# Patient Record
Sex: Male | Born: 1960 | Race: White | Hispanic: No | Marital: Single | Smoking: Never smoker
Health system: Southern US, Community
[De-identification: ages and names within clinical notes are randomized; demographics above are authoritative.]

## PROBLEM LIST (undated history)

## (undated) HISTORY — PX: CARPAL TUNNEL RELEASE: SHX101

## (undated) HISTORY — PX: BACK SURGERY: SHX140

---

## 2003-08-07 ENCOUNTER — Inpatient Hospital Stay (HOSPITAL_COMMUNITY): Admission: EM | Admit: 2003-08-07 | Discharge: 2003-08-09 | Payer: Self-pay | Admitting: Emergency Medicine

## 2003-08-07 IMAGING — CR DG CHEST 2V
2 series · 2 of 2 positions shown · non-contrast
Comparison: none

CLINICAL DATA: Pre-op HNP surgery.
 TWO VIEW CHEST   - [DATE]

[view not recorded (1 of 2)]
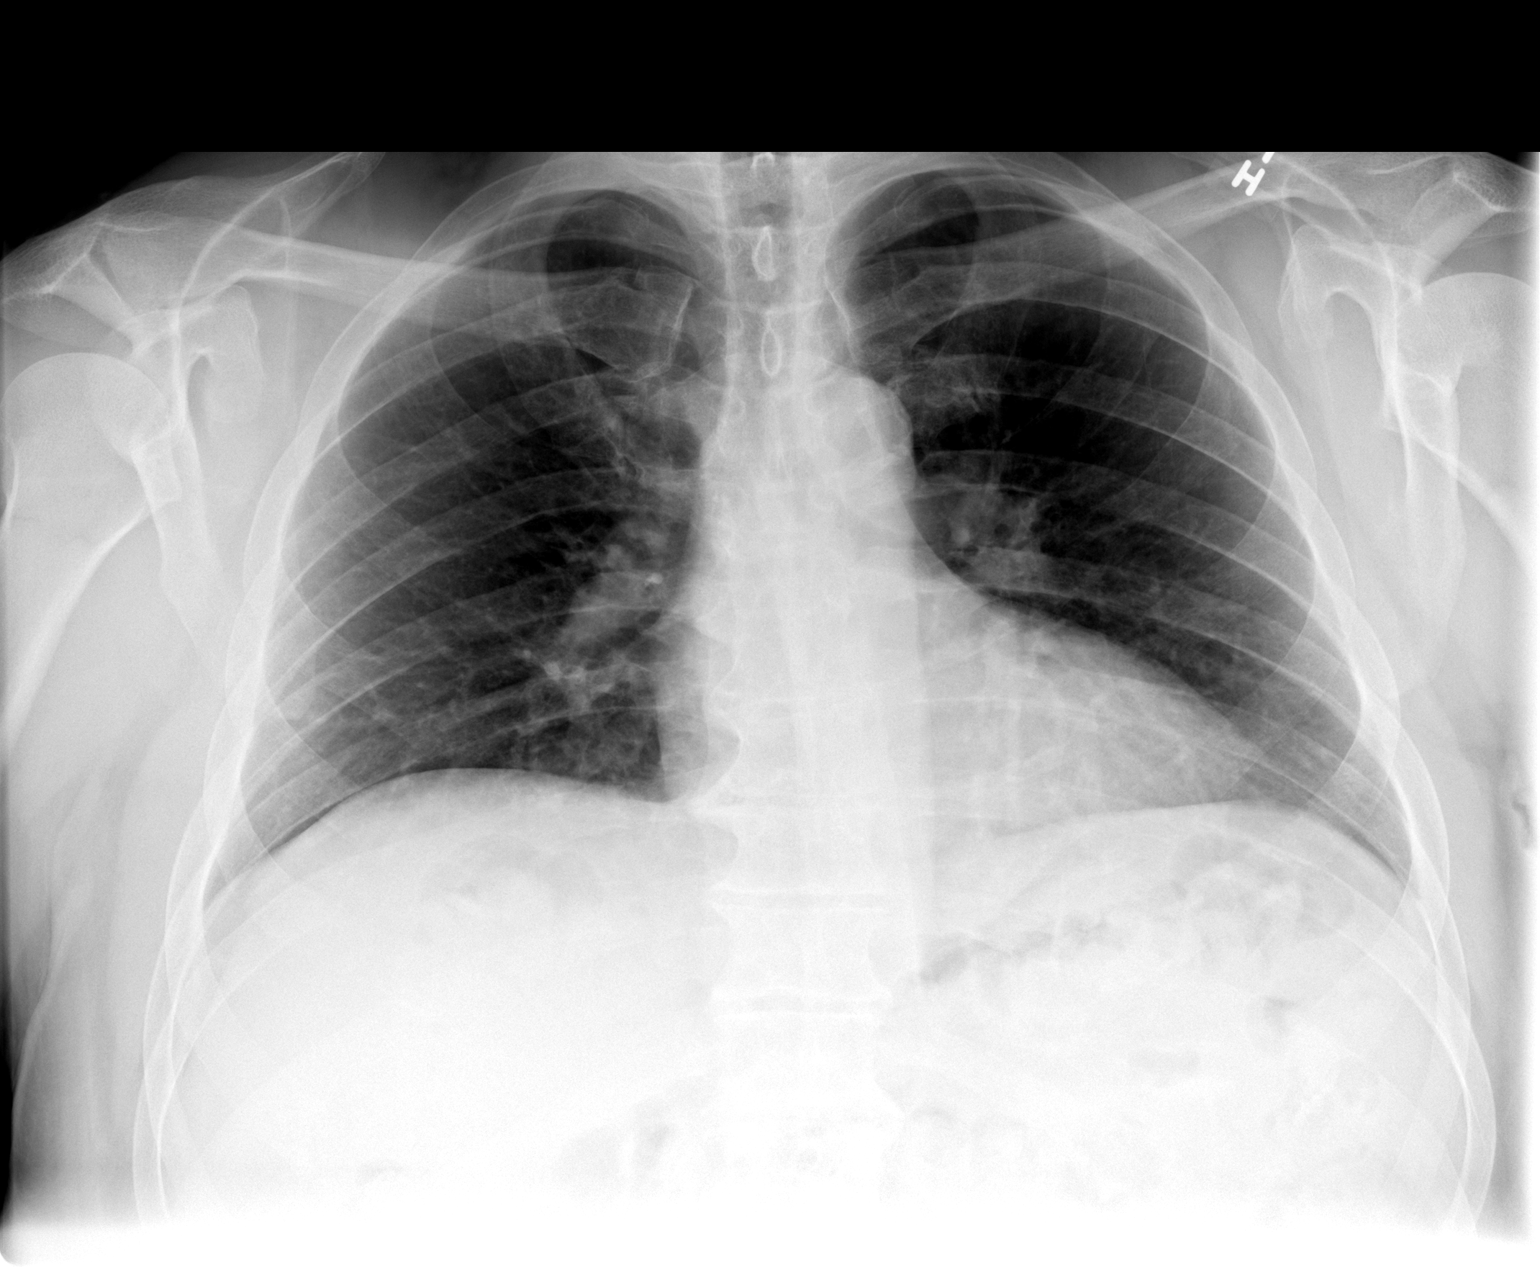

[view not recorded (2 of 2)]
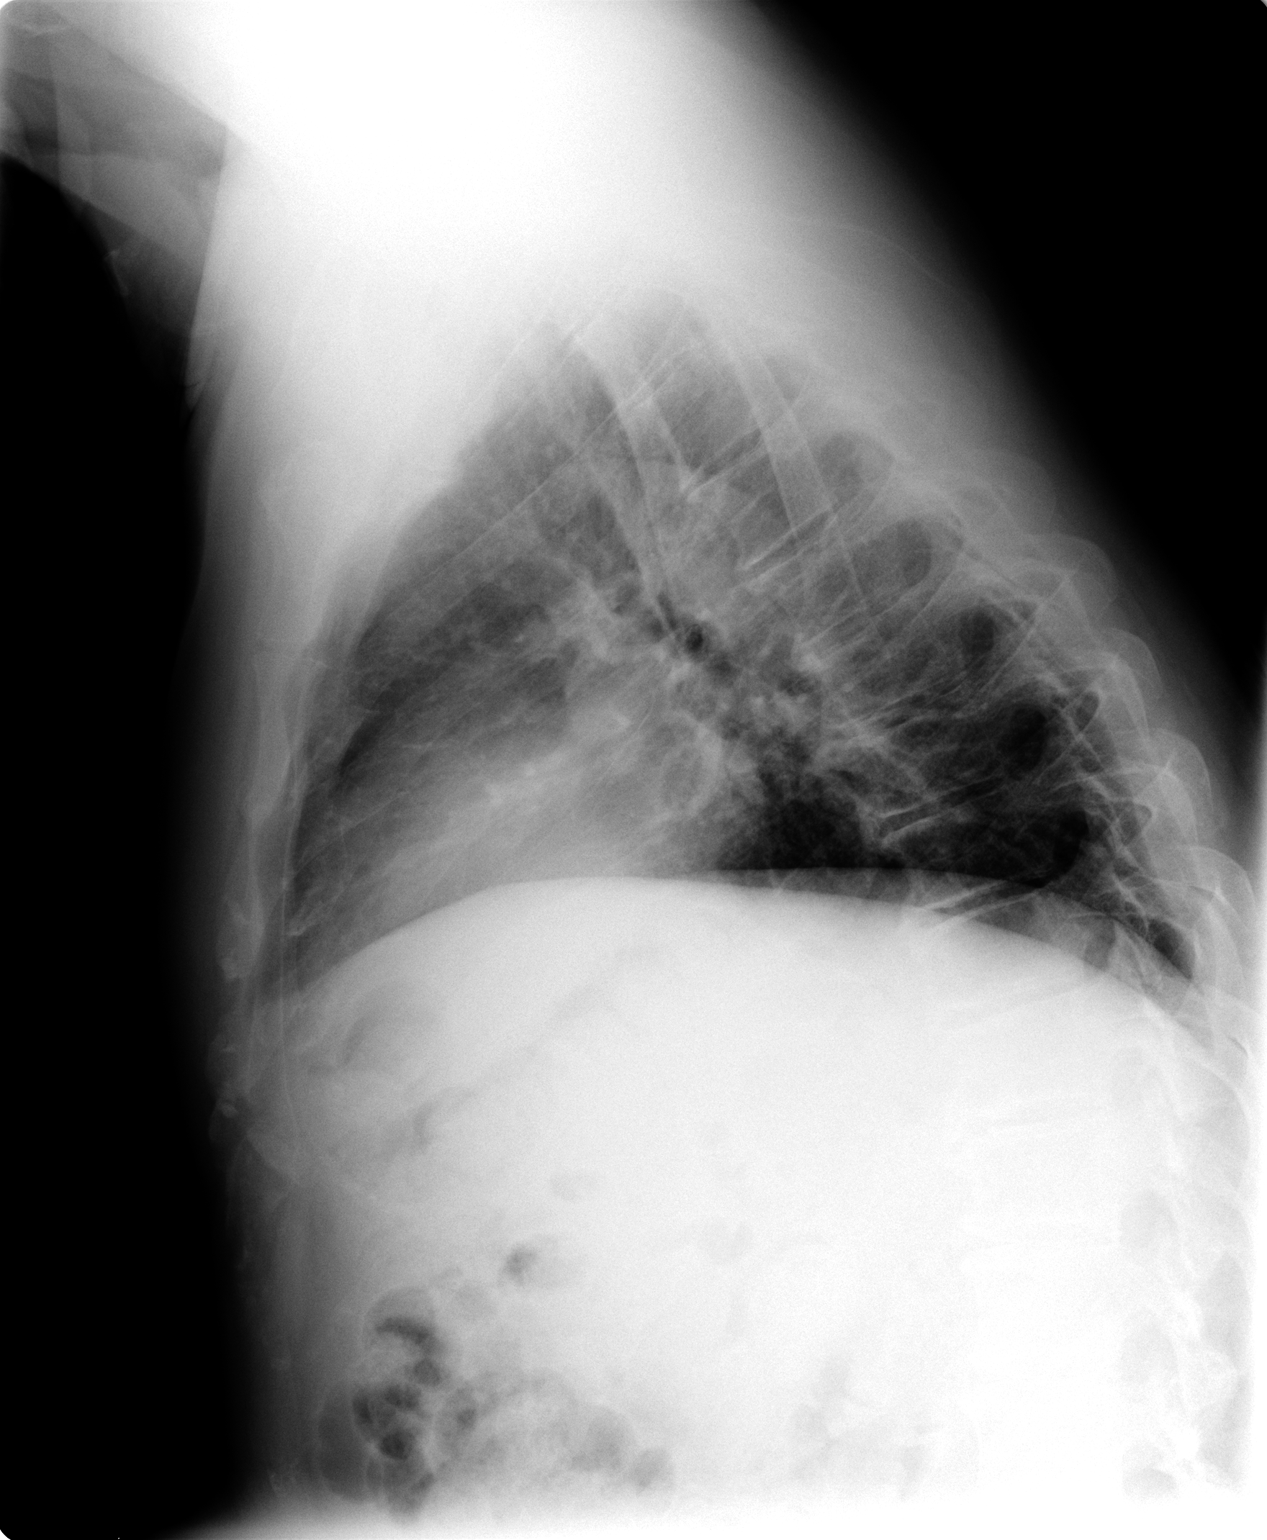

[2 of 2 positions shown; findings below may reference images not displayed]

FINDINGS: Mildly Low volume film with minimal bibasilar atelectasis.  Mild peribronchial thickening is noted.  Upper limits normal heart size.  The remainder of the lungs are clear.  No evidence of focal air space disease.
 IMPRESSION
 Mild peribronchial thickening.

## 2010-12-21 ENCOUNTER — Emergency Department (HOSPITAL_COMMUNITY)
Admission: EM | Admit: 2010-12-21 | Discharge: 2010-12-21 | Disposition: A | Discharge status: Home / Self Care | Payer: BC Managed Care – PPO | Attending: Emergency Medicine | Admitting: Emergency Medicine

## 2010-12-21 DIAGNOSIS — IMO0002 Reserved for concepts with insufficient information to code with codable children: Secondary | ICD-10-CM | POA: Insufficient documentation

## 2010-12-21 DIAGNOSIS — R51 Headache: Secondary | ICD-10-CM | POA: Insufficient documentation

## 2010-12-21 DIAGNOSIS — T169XXA Foreign body in ear, unspecified ear, initial encounter: Secondary | ICD-10-CM | POA: Insufficient documentation

## 2010-12-21 MED ORDER — HYDROCODONE-ACETAMINOPHEN 5-325 MG PO TABS: 2.0000 | ORAL_TABLET | ORAL | Status: AC | PRN

## 2010-12-21 MED ORDER — ANTIPYRINE-BENZOCAINE 5.4-1.4 % OT SOLN
3.0000 [drp] | OTIC | Status: DC | PRN
  Administered 2010-12-21: 4 [drp] via OTIC
  Filled 2010-12-21: qty 10

## 2010-12-21 MED ORDER — NEOMYCIN-POLYMYXIN-HC 3.5-10000-1 OT SOLN
3.0000 [drp] | Freq: Three times a day (TID) | OTIC | Status: DC
  Administered 2010-12-21: 3 [drp] via OTIC
  Filled 2010-12-21: qty 10

## 2010-12-21 NOTE — ED Notes (Signed)
Pt reports around 10am this am something flew into R ear.

## 2010-12-21 NOTE — ED Provider Notes (Signed)
History     CSN: [id number] Arrival date & time: 12/21/2010 12:08 PM   First MD Initiated Contact with Patient 12/21/10 1342      Chief Complaint  Patient presents with  . Otalgia    (Consider location/radiation/quality/duration/timing/severity/associated sxs/prior treatment) HPI Comments: [place] an had something to fly into the right ear. He tried irrigating by could not get the bug out.  Patient is a 50 y.o. male presenting with ear pain. The history is provided by the patient.  Otalgia This is a new problem. The current episode started 1 to 2 hours ago. There is pain in the right ear. The problem occurs constantly. The problem has been gradually worsening. There has been no fever. Associated symptoms include headaches. Pertinent negatives include no ear discharge, no abdominal pain, no neck pain and no cough. His past medical history does not include chronic ear infection, hearing loss or tympanostomy tube.    History reviewed. No pertinent past medical history.  Past Surgical History  Procedure Date  . Back surgery     No family history on file.  History  Substance Use Topics  . Smoking status: Never Smoker   . Smokeless tobacco: Not on file  . Alcohol Use: No      Review of Systems  Constitutional: Negative for activity change.       All ROS Neg except as noted in HPI  HENT: Positive for ear pain. Negative for nosebleeds, neck pain and ear discharge.   Eyes: Negative for photophobia and discharge.  Respiratory: Negative for cough, shortness of breath and wheezing.   Cardiovascular: Negative for chest pain and palpitations.  Gastrointestinal: Negative for abdominal pain and blood in stool.  Genitourinary: Negative for dysuria, frequency and hematuria.  Musculoskeletal: Negative for back pain and arthralgias.  Skin: Negative.   Neurological: Positive for headaches. Negative for dizziness, seizures and speech difficulty.    Psychiatric/Behavioral: Negative for hallucinations and confusion.    Allergies  Review of patient's allergies indicates no known allergies.  Home Medications  No current outpatient prescriptions on file.  BP 139/98  Pulse 85  Temp(Src) 98.3 F (36.8 C) (Oral)  Resp 18  Ht 5\' 10"  (1.778 m)  Wt 230 lb (104.327 kg)  BMI 33.00 kg/m2  SpO2 100%  Physical Exam  Nursing note and vitals reviewed. Constitutional: He is oriented to person, place, and time. He appears well-developed and well-nourished.  Non-toxic appearance.  HENT:  Head: Normocephalic.  Right Ear: No drainage. A foreign body is present. Tympanic membrane is injected.  Left Ear: Tympanic membrane, external ear and ear canal normal.  Eyes: EOM and lids are normal. Pupils are equal, round, and reactive to light.  Neck: Normal range of motion. Neck supple. Carotid bruit is not present.  Cardiovascular: Normal rate, regular rhythm, normal heart sounds, intact distal pulses and normal pulses.   Pulmonary/Chest: Breath sounds normal. No respiratory distress.  Abdominal: Soft. Bowel sounds are normal. There is no tenderness. There is no guarding.  Musculoskeletal: Normal range of motion.  Lymphadenopathy:       Head (right side): No submandibular adenopathy present.       Head (left side): No submandibular adenopathy present.    He has no cervical adenopathy.  Neurological: He is alert and oriented to person, place, and time. He has normal strength. No cranial nerve deficit or sensory deficit.  Skin: Skin is warm and dry.  Psychiatric: He has a normal  mood and affect. His speech is normal.    ED Course  Procedures (including critical care time) The right EAC was irrigated with warm water until FB removed. Shallow laceration of the EAC and increase redness of the TM on the right. Labs Reviewed - No data to display No results found.      MDM  FB noted in the right EAC with mild trauma. Cortasporin and oral  antibiotics ordered.        Kathie Dike, [place] 01/03/11 [phone]

## 2010-12-27 NOTE — ED Provider Notes (Signed)
Medical screening examination/treatment/procedure(s) were performed by non-physician practitioner and as supervising physician I was immediately available for consultation/collaboration.  Karolyne Timmons P Majid Mccravy, MD 12/27/10 0654 

## 2011-01-06 NOTE — ED Provider Notes (Signed)
Medical screening examination/treatment/procedure(s) were performed by non-physician practitioner and as supervising physician I was immediately available for consultation/collaboration.  Nicholes Stairs, MD 01/06/11 [phone]

## 2013-09-29 ENCOUNTER — Telehealth: Payer: Self-pay

## 2013-09-29 NOTE — Telephone Encounter (Signed)
Patient received letter from triage nurse. Triage nurse not available to take call. Patient works 3rd shift and was getting ready to go to bed and wanted to know when would be a good time to call in the mornings. I told him around 11am or I could let DS know he works 3 rd shift and see if she could call first thing in the morning around 8am. Patient said that he would just call back later. [phone]

## 2013-10-08 ENCOUNTER — Other Ambulatory Visit: Payer: Self-pay

## 2013-10-08 DIAGNOSIS — Z1211 Encounter for screening for malignant neoplasm of colon: Secondary | ICD-10-CM

## 2013-10-09 NOTE — Telephone Encounter (Signed)
Appropriate.

## 2013-10-09 NOTE — Telephone Encounter (Signed)
Gastroenterology Pre-Procedure Review  Request Date: 09/29/2013 Requesting Physician: Dr. Phillips OdorGolding  PATIENT REVIEW QUESTIONS: The patient responded to the following health history questions as indicated:    1. Diabetes Melitis: no 2. Joint replacements in the past 12 months: no 3. Major health problems in the past 3 months: no 4. Has an artificial valve or MVP: no 5. Has a defibrillator: no 6. Has been advised in past to take antibiotics in advance of a procedure like teeth cleaning: no    MEDICATIONS & ALLERGIES:    Patient reports the following regarding taking any blood thinners:   Plavix? no Aspirin? YES Coumadin? no  Patient confirms/reports the following medications:  Current Outpatient Prescriptions  Medication Sig Dispense Refill  . aspirin 81 MG tablet Take 81 mg by mouth daily.      . Multiple Vitamin (MULTIVITAMIN) tablet Take 1 tablet by mouth daily. 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    Patient confirms/reports the following allergies:  Allergies  Allergen Reactions  . Bee Venom Other (See Comments)    Yellow Jackets :Blindness, pain    No orders of the defined types were placed in this encounter.    AUTHORIZATION INFORMATION Primary Insurance:   ID #:   Group #:  Pre-Cert / Auth required:  Pre-Cert / Auth #:   Secondary Insurance:  ID #:   Group #:  Pre-Cert / Auth required: Pre-Cert / Auth #:   SCHEDULE INFORMATION: Procedure has been scheduled as follows:  Date: 11/03/2013                  Time:  7:30 AM Location: [facility] Short Stay  This Gastroenterology Pre-Precedure Review Form is being routed to the following provider(s): R. Roetta SessionsMichael Rourk, MD

## 2013-10-14 ENCOUNTER — Telehealth: Payer: Self-pay

## 2013-10-14 MED ORDER — PEG-KCL-NACL-NASULF-NA ASC-C 100 G PO SOLR: 1.0000 | ORAL | Status: DC

## 2013-10-14 NOTE — Telephone Encounter (Signed)
Rx sent to the pharmacy and instructions mailed to pt.  

## 2013-10-14 NOTE — Addendum Note (Signed)
Addended by: Lavena BullionSTEWART, [place] Mincey H on: 10/14/2013 10:22 AM   Modules accepted: Orders

## 2013-10-14 NOTE — Telephone Encounter (Signed)
I called and left Vm for a return call to get phone number on the back of his insurance card for providers, to see if a PA is required for a screening colonoscopy.

## 2013-10-22 ENCOUNTER — Encounter (HOSPITAL_COMMUNITY): Payer: Self-pay | Admitting: Pharmacy Technician

## 2013-10-30 ENCOUNTER — Telehealth: Payer: Self-pay

## 2013-10-30 NOTE — Telephone Encounter (Signed)
I called and spoke to [place] at [place]  [phone] ) and PA is not required for screening colonoscopy.

## 2013-11-03 ENCOUNTER — Encounter (HOSPITAL_COMMUNITY)
Admission: RE | Disposition: A | Discharge status: Home / Self Care | Payer: Self-pay | Source: Ambulatory Visit | Attending: Internal Medicine

## 2013-11-03 ENCOUNTER — Ambulatory Visit (HOSPITAL_COMMUNITY)
Admission: RE | Admit: 2013-11-03 | Discharge: 2013-11-03 | Disposition: A | Discharge status: Home / Self Care | Payer: BC Managed Care – PPO | Source: Ambulatory Visit | Attending: Internal Medicine | Admitting: Internal Medicine

## 2013-11-03 ENCOUNTER — Encounter (HOSPITAL_COMMUNITY): Payer: Self-pay

## 2013-11-03 DIAGNOSIS — Z7982 Long term (current) use of aspirin: Secondary | ICD-10-CM | POA: Insufficient documentation

## 2013-11-03 DIAGNOSIS — K5731 Diverticulosis of large intestine without perforation or abscess with bleeding: Secondary | ICD-10-CM

## 2013-11-03 DIAGNOSIS — K5730 Diverticulosis of large intestine without perforation or abscess without bleeding: Secondary | ICD-10-CM | POA: Diagnosis not present

## 2013-11-03 DIAGNOSIS — Z1211 Encounter for screening for malignant neoplasm of colon: Secondary | ICD-10-CM | POA: Insufficient documentation

## 2013-11-03 HISTORY — PX: COLONOSCOPY: SHX5424

## 2013-11-03 SURGERY — COLONOSCOPY
Anesthesia: Moderate Sedation

## 2013-11-03 MED ORDER — MEPERIDINE HCL 100 MG/ML IJ SOLN
INTRAMUSCULAR | Status: AC
  Filled 2013-11-03: qty 2

## 2013-11-03 MED ORDER — MEPERIDINE HCL 100 MG/ML IJ SOLN
INTRAMUSCULAR | Status: DC | PRN
  Administered 2013-11-03: 25 mg via INTRAVENOUS
  Administered 2013-11-03: 50 mg via INTRAVENOUS

## 2013-11-03 MED ORDER — STERILE WATER FOR IRRIGATION IR SOLN
Status: DC | PRN
Start: 2013-11-03 — End: 2013-11-03
  Administered 2013-11-03: 08:00:00

## 2013-11-03 MED ORDER — SODIUM CHLORIDE 0.9 % IV SOLN
INTRAVENOUS | Status: DC
  Administered 2013-11-03: 07:00:00 via INTRAVENOUS

## 2013-11-03 MED ORDER — ONDANSETRON HCL 4 MG/2ML IJ SOLN
INTRAMUSCULAR | Status: AC
  Filled 2013-11-03: qty 2

## 2013-11-03 MED ORDER — MIDAZOLAM HCL 5 MG/5ML IJ SOLN
INTRAMUSCULAR | Status: DC | PRN
  Administered 2013-11-03: 1 mg via INTRAVENOUS
  Administered 2013-11-03 (×2): 2 mg via INTRAVENOUS

## 2013-11-03 MED ORDER — ONDANSETRON HCL 4 MG/2ML IJ SOLN
INTRAMUSCULAR | Status: DC | PRN
  Administered 2013-11-03: 4 mg via INTRAVENOUS

## 2013-11-03 MED ORDER — MIDAZOLAM HCL 5 MG/5ML IJ SOLN
INTRAMUSCULAR | Status: AC
  Filled 2013-11-03: qty 10

## 2013-11-03 NOTE — Op Note (Signed)
[facility] [address] [place], [address]   COLONOSCOPY PROCEDURE REPORT  PATIENT: Craig Ramirez, Craig Ramirez  MR#:         [id number] BIRTHDATE: [birth year] , 53  yrs. old GENDER: Male ENDOSCOPIST: R.  Roetta Sessions, MD FACP FACG REFERRED BY:  Assunta Found, M.D. PROCEDURE DATE:  11/03/2013 PROCEDURE:     Screening colonoscopy  INDICATIONS: First ever average risk colon cancer screening examination  INFORMED CONSENT:  The risks, benefits, alternatives and imponderables including but not limited to bleeding, perforation as well as the possibility of a missed lesion have been reviewed.  The potential for biopsy, lesion removal, etc. have also been discussed.  Questions have been answered.  All parties agreeable. Please see the history and physical in the medical record for more information.  MEDICATIONS: Versed 5 mg IV and Demerol 75 mg IV in divided doses. Zofran 4 mg IV  DESCRIPTION OF PROCEDURE:  After a digital rectal exam was performed, the EC-[id number] ([id number])  colonoscope was advanced from the anus through the rectum and colon to the area of the cecum, ileocecal valve and appendiceal orifice.  The cecum was deeply intubated.  These structures were well-seen and photographed for the record.  From the level of the cecum and ileocecal valve, the scope was slowly and cautiously withdrawn.  The mucosal surfaces were carefully surveyed utilizing scope tip deflection to facilitate fold flattening as needed.  The scope was pulled down into the rectum where a thorough examination including retroflexion was performed.    FINDINGS:  Adequate preparation.  Normal rectum. Scattered pancolonic diverticula; remainder of the colonic mucosa appeared normal.  THERAPEUTIC / DIAGNOSTIC MANEUVERS PERFORMED:  none  COMPLICATIONS: none  CECAL WITHDRAWAL TIME:  10 minutes  IMPRESSION:  Colonic diverticulosis  RECOMMENDATIONS: Repeat screening colonoscopy in 10  years   _______________________________ eSigned:  R. Roetta Sessions, MD FACP [facility] 11/03/2013 8:04 AM   CC:

## 2013-11-03 NOTE — H&P (Signed)
@  ZOXW@   Primary Care Physician:  Colette Ribas, MD Primary Gastroenterologist:  Dr. Jena Gauss  Pre-Procedure History & Physical: HPI:  Craig Ramirez is a 53 y.o. male is here for a screening colonoscopy. No bowel symptoms. No family history of colon cancer.   No prior colonoscopy.  History reviewed. No pertinent past medical history.  Past Surgical History  Procedure Laterality Date  . Back surgery    . Carpal tunnel release Bilateral     Prior to Admission medications   Medication Sig Start Date End Date Taking? Authorizing Provider  aspirin 81 MG tablet Take 81 mg by mouth daily.   Yes Historical Provider, MD    Allergies as of 10/08/2013 - Review Complete 12/21/2010  Allergen Reaction Noted  . Bee venom Other (See Comments) 12/21/2010    History reviewed. No pertinent family history.  History   Social History  . Marital Status: Single    Spouse Name: N/A    Number of Children: N/A  . Years of Education: N/A   Occupational History  . Not on file.   Social History Main Topics  . Smoking status: Never Smoker   . Smokeless tobacco: Not on file  . Alcohol Use: No  . Drug Use: No  . Sexual Activity: Yes    Birth Control/ Protection: None   Other Topics Concern  . Not on file   Social History Narrative  . No narrative on file    Review of Systems: See HPI, otherwise negative ROS  Physical Exam: BP 134/80  Pulse 58  Temp(Src) 98.3 F (36.8 C) (Oral)  Resp 12  Ht  (1.778 m)  Wt 230 lb (104.327 kg)  BMI 33.00 kg/m2  SpO2 96% General:   Alert,  Well-developed, well-nourished, pleasant and cooperative in NAD Head:  Normocephalic and atraumatic. Eyes:  Sclera clear, no icterus.   Conjunctiva pink. Ears:  Normal auditory acuity. Nose:  No deformity, discharge,  or lesions. Mouth:  No deformity or lesions, dentition normal. Neck:  Supple; no masses or thyromegaly. Lungs:  Clear throughout to auscultation.   No wheezes, crackles, or rhonchi.  No acute distress. Heart:  Regular rate and rhythm; no murmurs, clicks, rubs,  or gallops. Abdomen:  Soft, nontender and nondistended. No masses, hepatosplenomegaly or hernias noted. Normal bowel sounds, without guarding, and without rebound.   Msk:  Symmetrical without gross deformities. Normal posture. Pulses:  Normal pulses noted. Extremities:  Without clubbing or edema. Neurologic:  Alert and  oriented x4;  grossly normal neurologically. Skin:  Intact without significant lesions or rashes. Cervical Nodes:  No significant cervical adenopathy. Psych:  Alert and cooperative. Normal mood and affect.  Impression/Plan: Craig Ramirez is now here to undergo a screening colonoscopy.  First-ever average risk screening examination  Risks, benefits, limitations, imponderables and alternatives regarding colonoscopy have been reviewed with the patient. Questions have been answered. All parties agreeable.     Notice:  This dictation was prepared with Dragon dictation along with smaller phrase technology. Any transcriptional errors that result from this process are unintentional and may not be corrected upon review.

## 2013-11-03 NOTE — Discharge Instructions (Addendum)
Colonoscopy Discharge Instructions  Read the instructions outlined below and refer to this sheet in the next few weeks. These discharge instructions provide you with general information on caring for yourself after you leave the hospital. Your doctor may also give you specific instructions. While your treatment has been planned according to the most current medical practices available, unavoidable complications occasionally occur. If you have any problems or questions after discharge, call Dr. Gala Romney at [phone]. ACTIVITY  You may resume your regular activity, but move at a slower pace for the next 24 hours.   Take frequent rest periods for the next 24 hours.   Walking will help get rid of the air and reduce the bloated feeling in your belly (abdomen).   No driving for 24 hours (because of the medicine (anesthesia) used during the test).    Do not sign any important legal documents or operate any machinery for 24 hours (because of the anesthesia used during the test).  NUTRITION  Drink plenty of fluids.   You may resume your normal diet as instructed by your doctor.   Begin with a light meal and progress to your normal diet. Heavy or fried foods are harder to digest and may make you feel sick to your stomach (nauseated).   Avoid alcoholic beverages for 24 hours or as instructed.  MEDICATIONS  You may resume your normal medications unless your doctor tells you otherwise.  WHAT YOU CAN EXPECT TODAY  Some feelings of bloating in the abdomen.   Passage of more gas than usual.   Spotting of blood in your stool or on the toilet paper.  IF YOU HAD POLYPS REMOVED DURING THE COLONOSCOPY:  No aspirin products for 7 days or as instructed.   No alcohol for 7 days or as instructed.   Eat a soft diet for the next 24 hours.  FINDING OUT THE RESULTS OF YOUR TEST Not all test results are available during your visit. If your test results are not back during the visit, make an appointment  with your caregiver to find out the results. Do not assume everything is normal if you have not heard from your caregiver or the medical facility. It is important for you to follow up on all of your test results.  SEEK IMMEDIATE MEDICAL ATTENTION IF:  You have more than a spotting of blood in your stool.   Your belly is swollen (abdominal distention).   You are nauseated or vomiting.   You have a temperature over 101.   You have abdominal pain or discomfort that is severe or gets worse throughout the day.   Diverticulosis Diverticulosis is the condition that develops when small pouches (diverticula) form in the wall of your colon. Your colon, or large intestine, is where water is absorbed and stool is formed. The pouches form when the inside layer of your colon pushes through weak spots in the outer layers of your colon. CAUSES  No one knows exactly what causes diverticulosis. RISK FACTORS  Being older than 60. Your risk for this condition increases with age. Diverticulosis is rare in people younger than 40 years. By age 83, almost everyone has it.  Eating a low-fiber diet.  Being frequently constipated.  Being overweight.  Not getting enough exercise.  Smoking.  Taking over-the-counter pain medicines, like aspirin and ibuprofen. SYMPTOMS  Most people with diverticulosis do not have symptoms. DIAGNOSIS  Because diverticulosis often has no symptoms, health care providers often discover the condition during an exam for other  colon problems. In many cases, a health care provider will diagnose diverticulosis while using a flexible scope to examine the colon (colonoscopy). TREATMENT  If you have never developed an infection related to diverticulosis, you may not need treatment. If you have had an infection before, treatment may include:  Eating more fruits, vegetables, and grains.  Taking a fiber supplement.  Taking a live bacteria supplement (probiotic).  Taking medicine to  relax your colon. HOME CARE INSTRUCTIONS   Drink at least 6-8 glasses of water each day to prevent constipation.  Try not to strain when you have a bowel movement.  Keep all follow-up appointments. If you have had an infection before:  Increase the fiber in your diet as directed by your health care provider or dietitian.  Take a dietary fiber supplement if your health care provider approves.  Only take medicines as directed by your health care provider. SEEK MEDICAL CARE IF:   You have abdominal pain.  You have bloating.  You have cramps.  You have not gone to the bathroom in 3 days. SEEK IMMEDIATE MEDICAL CARE IF:   Your pain gets worse.  Yourbloating becomes very bad.  You have a fever or chills, and your symptoms suddenly get worse.  You begin vomiting.  You have bowel movements that are bloody or black. MAKE SURE YOU:  Understand these instructions.  Will watch your condition.  Will get help right away if you are not doing well or get worse. Document Released: 11/18/2003 Document Revised: 02/25/2013 Document Reviewed: 01/15/2013 [facility] Patient Information 2015 [place], [place]. This information is not intended to replace advice given to you by your health care provider. Make sure you discuss any questions you have with your health care provider.    Diverticulosis information provided  Repeat colonoscopy in 10 years for screening purposes

## 2017-07-27 DIAGNOSIS — J0140 Acute pansinusitis, unspecified: Secondary | ICD-10-CM | POA: Diagnosis not present

## 2017-07-27 DIAGNOSIS — H66003 Acute suppurative otitis media without spontaneous rupture of ear drum, bilateral: Secondary | ICD-10-CM | POA: Diagnosis not present

## 2019-06-24 DIAGNOSIS — Z0000 Encounter for general adult medical examination without abnormal findings: Secondary | ICD-10-CM | POA: Diagnosis not present

## 2019-06-24 DIAGNOSIS — Z1389 Encounter for screening for other disorder: Secondary | ICD-10-CM | POA: Diagnosis not present

## 2019-06-24 DIAGNOSIS — R7309 Other abnormal glucose: Secondary | ICD-10-CM | POA: Diagnosis not present

## 2019-06-24 DIAGNOSIS — E6609 Other obesity due to excess calories: Secondary | ICD-10-CM | POA: Diagnosis not present

## 2019-06-24 DIAGNOSIS — Z6835 Body mass index (BMI) 35.0-35.9, adult: Secondary | ICD-10-CM | POA: Diagnosis not present

## 2020-10-22 DIAGNOSIS — U071 COVID-19: Secondary | ICD-10-CM | POA: Diagnosis not present

## 2021-03-04 DIAGNOSIS — Z1331 Encounter for screening for depression: Secondary | ICD-10-CM | POA: Diagnosis not present

## 2021-03-04 DIAGNOSIS — E785 Hyperlipidemia, unspecified: Secondary | ICD-10-CM | POA: Diagnosis not present

## 2021-03-04 DIAGNOSIS — J309 Allergic rhinitis, unspecified: Secondary | ICD-10-CM | POA: Diagnosis not present

## 2021-03-04 DIAGNOSIS — Z6835 Body mass index (BMI) 35.0-35.9, adult: Secondary | ICD-10-CM | POA: Diagnosis not present

## 2021-03-04 DIAGNOSIS — R7309 Other abnormal glucose: Secondary | ICD-10-CM | POA: Diagnosis not present

## 2021-03-04 DIAGNOSIS — Z0000 Encounter for general adult medical examination without abnormal findings: Secondary | ICD-10-CM | POA: Diagnosis not present

## 2022-02-03 DIAGNOSIS — Z1331 Encounter for screening for depression: Secondary | ICD-10-CM | POA: Diagnosis not present

## 2022-02-03 DIAGNOSIS — Z0000 Encounter for general adult medical examination without abnormal findings: Secondary | ICD-10-CM | POA: Diagnosis not present

## 2022-02-03 DIAGNOSIS — E6609 Other obesity due to excess calories: Secondary | ICD-10-CM | POA: Diagnosis not present

## 2022-02-03 DIAGNOSIS — Z6836 Body mass index (BMI) 36.0-36.9, adult: Secondary | ICD-10-CM | POA: Diagnosis not present

## 2022-02-03 DIAGNOSIS — Z6835 Body mass index (BMI) 35.0-35.9, adult: Secondary | ICD-10-CM | POA: Diagnosis not present

## 2022-02-03 DIAGNOSIS — E785 Hyperlipidemia, unspecified: Secondary | ICD-10-CM | POA: Diagnosis not present

## 2022-04-18 DIAGNOSIS — H1011 Acute atopic conjunctivitis, right eye: Secondary | ICD-10-CM | POA: Diagnosis not present

## 2022-04-27 DIAGNOSIS — H25812 Combined forms of age-related cataract, left eye: Secondary | ICD-10-CM | POA: Diagnosis not present

## 2022-05-30 DIAGNOSIS — E6609 Other obesity due to excess calories: Secondary | ICD-10-CM | POA: Diagnosis not present

## 2022-05-30 DIAGNOSIS — S335XXA Sprain of ligaments of lumbar spine, initial encounter: Secondary | ICD-10-CM | POA: Diagnosis not present

## 2022-05-30 DIAGNOSIS — Z6837 Body mass index (BMI) 37.0-37.9, adult: Secondary | ICD-10-CM | POA: Diagnosis not present

## 2022-06-16 DIAGNOSIS — J069 Acute upper respiratory infection, unspecified: Secondary | ICD-10-CM | POA: Diagnosis not present

## 2022-12-11 DIAGNOSIS — H01001 Unspecified blepharitis right upper eyelid: Secondary | ICD-10-CM | POA: Diagnosis not present

## 2022-12-11 DIAGNOSIS — H25812 Combined forms of age-related cataract, left eye: Secondary | ICD-10-CM | POA: Diagnosis not present

## 2022-12-11 DIAGNOSIS — H01004 Unspecified blepharitis left upper eyelid: Secondary | ICD-10-CM | POA: Diagnosis not present

## 2022-12-11 DIAGNOSIS — H01002 Unspecified blepharitis right lower eyelid: Secondary | ICD-10-CM | POA: Diagnosis not present

## 2023-01-08 DIAGNOSIS — H25812 Combined forms of age-related cataract, left eye: Secondary | ICD-10-CM | POA: Diagnosis not present

## 2023-01-15 NOTE — H&P (Signed)
Surgical History & Physical  Patient Name: Craig Ramirez  DOB: 1960-04-15  Surgery: Cataract extraction with intraocular lens implant phacoemulsification; Left Eye Surgeon: Fabio Pierce MD Surgery Date: 01/19/2023 Pre-Op Date: 12/11/2022  HPI: A 39 Yr. old male patient 1. The patient complains of difficulty when reading fine print, books, newspaper, instructions etc., which began many years ago. The left eye is affected. The episode is constant. The patient describes foggy and hazy symptoms affecting their eyes/vision. The condition's severity is worsening. This is negatively affecting the patient's quality of life and the patient is unable to function adequately in life with the current level of vision. HPI Completed by Dr. Fabio Pierce  Medical History: Choroidal nevus  Allergies  Review of Systems Negative Allergic/Immunologic Negative Cardiovascular Negative Constitutional Negative Ear, Nose, Mouth & Throat Negative Endocrine Negative Eyes Negative Gastrointestinal Negative Genitourinary Negative Hemotologic/Lymphatic Negative Integumentary Negative Musculoskeletal Negative Neurological Negative Psychiatry Negative Respiratory  Social Never smoked   Medication None  Sx/Procedures Back Surgery, Carpal Tunnel  Drug Allergies  NKDA  History & Physical: Heent: cataract  NECK: supple without bruits LUNGS: lungs clear to auscultation CV: regular rate and rhythm Abdomen: soft and non-tender  Impression & Plan: Assessment: 1.  COMBINED FORMS AGE RELATED CATARACT; Left Eye (H25.812) 2.  BLEPHARITIS; Right Upper Lid, Right Lower Lid, Left Upper Lid, Left Lower Lid (H01.001, H01.002,H01.004,H01.005) 3.  DERMATOCHALASIS, no surgery; Right Upper Lid, Left Upper Lid (H02.831, H02.834) 4.  Pinguecula; Both Eyes (H11.153) 5.  ARCUS SENILIS; Both Eyes (H18.413)  Plan: 1.  Cataract accounts for the patient's decreased vision. This visual impairment is not correctable  with a tolerable change in glasses or contact lenses. Cataract surgery with an implantation of a new lens should significantly improve the visual and functional status of the patient. Discussed all risks, benefits, alternatives, and potential complications. Discussed the procedures and recovery. Patient desires to have surgery. A-scan ordered and performed today for intra-ocular lens calculations. The surgery will be performed in order to improve vision for driving, reading, and for eye examinations. Recommend phacoemulsification with intra-ocular lens. Recommend Dextenza for post-operative pain and inflammation. Left Eye only. Dilates poorly - shugarcaine by protocol. Malyugin Ring. Omidira.  2.  Blepharitis is present - recommend regular lid cleaning.  3.  Asymptomatic, recommend observation for now. Findings, prognosis and treatment options reviewed.  4.  Observe; Artificial tears as needed for irritation.  5.  Discussed significance of finding Answered patient questions about finding

## 2023-01-17 ENCOUNTER — Encounter (HOSPITAL_COMMUNITY)
Admission: RE | Admit: 2023-01-17 | Discharge: 2023-01-17 | Disposition: A | Discharge status: Home / Self Care | Payer: BC Managed Care – PPO | Source: Ambulatory Visit | Attending: Ophthalmology | Admitting: Ophthalmology

## 2023-01-18 ENCOUNTER — Other Ambulatory Visit: Payer: Self-pay

## 2023-01-18 ENCOUNTER — Encounter (HOSPITAL_COMMUNITY): Payer: Self-pay

## 2023-01-19 ENCOUNTER — Ambulatory Visit (HOSPITAL_COMMUNITY): Payer: BC Managed Care – PPO | Admitting: Anesthesiology

## 2023-01-19 ENCOUNTER — Ambulatory Visit (HOSPITAL_COMMUNITY)
Admission: RE | Admit: 2023-01-19 | Discharge: 2023-01-19 | Disposition: A | Discharge status: Home / Self Care | Payer: BC Managed Care – PPO | Attending: Ophthalmology | Admitting: Ophthalmology

## 2023-01-19 ENCOUNTER — Encounter (HOSPITAL_COMMUNITY): Payer: Self-pay | Admitting: Ophthalmology

## 2023-01-19 ENCOUNTER — Encounter (HOSPITAL_COMMUNITY)
Admission: RE | Disposition: A | Discharge status: Home / Self Care | Payer: Self-pay | Source: Home / Self Care | Attending: Ophthalmology

## 2023-01-19 DIAGNOSIS — H25812 Combined forms of age-related cataract, left eye: Secondary | ICD-10-CM | POA: Diagnosis not present

## 2023-01-19 HISTORY — PX: CATARACT EXTRACTION W/PHACO: SHX586

## 2023-01-19 SURGERY — PHACOEMULSIFICATION, CATARACT, WITH IOL INSERTION
Anesthesia: Monitor Anesthesia Care | Site: Eye | Laterality: Left

## 2023-01-19 MED ORDER — LIDOCAINE HCL 3.5 % OP GEL
1.0000 | Freq: Once | OPHTHALMIC | Status: AC
  Administered 2023-01-19: 1 via OPHTHALMIC

## 2023-01-19 MED ORDER — SODIUM CHLORIDE 0.9% FLUSH
INTRAVENOUS | Status: DC | PRN
  Administered 2023-01-19: 5 mL via INTRAVENOUS

## 2023-01-19 MED ORDER — SODIUM HYALURONATE 23MG/ML IO SOSY
PREFILLED_SYRINGE | INTRAOCULAR | Status: DC | PRN
  Administered 2023-01-19: .6 mL via INTRAOCULAR

## 2023-01-19 MED ORDER — STERILE WATER FOR IRRIGATION IR SOLN
Status: DC | PRN
  Administered 2023-01-19: 250 mL

## 2023-01-19 MED ORDER — MIDAZOLAM HCL 2 MG/2ML IJ SOLN
INTRAMUSCULAR | Status: AC
  Filled 2023-01-19: qty 2

## 2023-01-19 MED ORDER — POVIDONE-IODINE 5 % OP SOLN
OPHTHALMIC | Status: DC | PRN
  Administered 2023-01-19: 1 via OPHTHALMIC

## 2023-01-19 MED ORDER — SODIUM HYALURONATE 10 MG/ML IO SOLUTION
PREFILLED_SYRINGE | INTRAOCULAR | Status: DC | PRN
  Administered 2023-01-19: .85 mL via INTRAOCULAR

## 2023-01-19 MED ORDER — MOXIFLOXACIN HCL 5 MG/ML IO SOLN
INTRAOCULAR | Status: DC | PRN
  Administered 2023-01-19: .2 mL via INTRACAMERAL

## 2023-01-19 MED ORDER — LIDOCAINE HCL (PF) 1 % IJ SOLN
INTRAOCULAR | Status: DC | PRN
  Administered 2023-01-19: 1 mL via OPHTHALMIC

## 2023-01-19 MED ORDER — TETRACAINE HCL 0.5 % OP SOLN
1.0000 [drp] | OPHTHALMIC | Status: AC | PRN
  Administered 2023-01-19 (×3): 1 [drp] via OPHTHALMIC

## 2023-01-19 MED ORDER — TROPICAMIDE 1 % OP SOLN
1.0000 [drp] | OPHTHALMIC | Status: AC | PRN
  Administered 2023-01-19 (×3): 1 [drp] via OPHTHALMIC

## 2023-01-19 MED ORDER — MIDAZOLAM HCL 5 MG/5ML IJ SOLN
INTRAMUSCULAR | Status: DC | PRN
  Administered 2023-01-19: 2 mg via INTRAVENOUS

## 2023-01-19 MED ORDER — EPINEPHRINE PF 1 MG/ML IJ SOLN
INTRAOCULAR | Status: DC | PRN
  Administered 2023-01-19: 500 mL

## 2023-01-19 MED ORDER — BSS IO SOLN
INTRAOCULAR | Status: DC | PRN
  Administered 2023-01-19: 15 mL via INTRAOCULAR

## 2023-01-19 MED ORDER — PHENYLEPHRINE HCL 2.5 % OP SOLN
1.0000 [drp] | OPHTHALMIC | Status: AC | PRN
  Administered 2023-01-19 (×3): 1 [drp] via OPHTHALMIC

## 2023-01-19 SURGICAL SUPPLY — 14 items
CATARACT SUITE SIGHTPATH (MISCELLANEOUS) ×1
CLOTH BEACON ORANGE TIMEOUT ST (SAFETY) ×1 IMPLANT
EYE SHIELD UNIVERSAL CLEAR (GAUZE/BANDAGES/DRESSINGS) IMPLANT
FEE CATARACT SUITE SIGHTPATH (MISCELLANEOUS) ×1 IMPLANT
GLOVE BIOGEL PI IND STRL 7.0 (GLOVE) ×2 IMPLANT
LENS IOL TECNIS EYHANCE 22.0 (Intraocular Lens) IMPLANT
NDL HYPO 18GX1.5 BLUNT FILL (NEEDLE) ×1 IMPLANT
NEEDLE HYPO 18GX1.5 BLUNT FILL (NEEDLE) ×1
PAD ARMBOARD 7.5X6 YLW CONV (MISCELLANEOUS) ×1 IMPLANT
POSITIONER HEAD 8X9X4 ADT (SOFTGOODS) ×1 IMPLANT
RING MALYGIN 7.0 (MISCELLANEOUS) IMPLANT
SYR TB 1ML LL NO SAFETY (SYRINGE) ×1 IMPLANT
TAPE SURG TRANSPORE 1 IN (GAUZE/BANDAGES/DRESSINGS) IMPLANT
WATER STERILE IRR 250ML POUR (IV SOLUTION) ×1 IMPLANT

## 2023-01-19 NOTE — Anesthesia Postprocedure Evaluation (Signed)
Anesthesia Post Note  Patient: Craig Ramirez  Procedure(s) Performed: CATARACT EXTRACTION PHACO AND INTRAOCULAR LENS PLACEMENT (IOC) (Left: Eye)  Patient location during evaluation: Short Stay Anesthesia Type: MAC Level of consciousness: awake and alert Pain management: pain level controlled Vital Signs Assessment: post-procedure vital signs reviewed and stable Respiratory status: spontaneous breathing Cardiovascular status: blood pressure returned to baseline and stable Postop Assessment: no apparent nausea or vomiting Anesthetic complications: no   No notable events documented.   Last Vitals:  Vitals:   01/19/23 1155 01/19/23 1318  BP: 130/70 139/79  Pulse: 64 73  Resp: 16 12  Temp: 36.6 C 36.8 C  SpO2: 97% 97%    Last Pain:  Vitals:   01/19/23 1318  TempSrc: Oral  PainSc: 0-No pain                 Bettyjo Lundblad

## 2023-01-19 NOTE — Interval H&P Note (Signed)
History and Physical Interval Note:  01/19/2023 12:51 PM  Craig Ramirez  has presented today for surgery, with the diagnosis of combined forms age related cataract, left eye.  The various methods of treatment have been discussed with the patient and family. After consideration of risks, benefits and other options for treatment, the patient has consented to  Procedure(s): CATARACT EXTRACTION PHACO AND INTRAOCULAR LENS PLACEMENT (IOC) (Left) as a surgical intervention.  The patient's history has been reviewed, patient examined, no change in status, stable for surgery.  I have reviewed the patient's chart and labs.  Questions were answered to the patient's satisfaction.     Fabio Pierce

## 2023-01-19 NOTE — Op Note (Signed)
Date of procedure: 01/19/23  Pre-operative diagnosis: Visually significant age-related combined cataract, Left Eye (H25.812)  Post-operative diagnosis: Visually significant age-related combined cataract, Left Eye (H25.812)  Procedure: Removal of cataract via phacoemulsification and insertion of intra-ocular lens [employer] DIB00 +22.0D into the capsular bag of the Left Eye  Attending surgeon: Rudy Jew. Zoei Amison, MD, MA  Anesthesia: MAC, Topical Akten  Complications: None  Estimated Blood Loss: <65mL (minimal)  Specimens: None  Implants: As above  Indications:  Visually significant age-related cataract, Left Eye  Procedure:  The patient was seen and identified in the pre-operative area. The operative eye was identified and dilated.  The operative eye was marked.  Topical anesthesia was administered to the operative eye.     The patient was then to the operative suite and placed in the supine position.  A timeout was performed confirming the patient, procedure to be performed, and all other relevant information.   The patient's face was prepped and draped in the usual fashion for intra-ocular surgery.  A lid speculum was placed into the operative eye and the surgical microscope moved into place and focused.  An inferotemporal paracentesis was created using a 20 gauge paracentesis blade.  Shugarcaine was injected into the anterior chamber.  Viscoelastic was injected into the anterior chamber.  A temporal clear-corneal main wound incision was created using a 2.56mm microkeratome.  A continuous curvilinear capsulorrhexis was initiated using an irrigating cystitome and completed using capsulorrhexis forceps.  Hydrodissection and hydrodeliniation were performed.  Viscoelastic was injected into the anterior chamber.  A phacoemulsification handpiece and a chopper as a second instrument were used to remove the nucleus and epinucleus. The irrigation/aspiration handpiece was used to remove any  remaining cortical material.   The capsular bag was reinflated with viscoelastic, checked, and found to be intact.  The intraocular lens was inserted into the capsular bag.  The irrigation/aspiration handpiece was used to remove any remaining viscoelastic.  The clear corneal wound and paracentesis wounds were then hydrated and checked with Weck-Cels to be watertight. 0.56mL of Moxfloxacin was injected into the anterior chamber. The lid-speculum was removed.  The drape was removed.  The patient's face was cleaned with a wet and dry 4x4.    A clear shield was taped over the eye. The patient was taken to the post-operative care unit in good condition, having tolerated the procedure well.  Post-Op Instructions: The patient will follow up at [facility] for a same day post-operative evaluation and will receive all other orders and instructions.

## 2023-01-19 NOTE — Transfer of Care (Signed)
Immediate Anesthesia Transfer of Care Note  Patient: Craig Ramirez  Procedure(s) Performed: CATARACT EXTRACTION PHACO AND INTRAOCULAR LENS PLACEMENT (IOC) (Left: Eye)  Patient Location: Short Stay  Anesthesia Type:MAC  Level of Consciousness: awake  Airway & Oxygen Therapy: Patient Spontanous Breathing  Post-op Assessment: Report given to RN  Post vital signs: Reviewed and stable  Last Vitals:  Vitals Value Taken Time  BP    Temp    Pulse    Resp    SpO2      Last Pain:  Vitals:   01/19/23 1155  PainSc: 0-No pain         Complications: No notable events documented.

## 2023-01-19 NOTE — Discharge Instructions (Signed)
Please discharge patient when stable, will follow up today with Dr. Carnella Fryman at the [facility] [place] office immediately following discharge.  Leave shield in place until visit.  All paperwork with discharge instructions will be given at the office.  [facility] Address:  [address]  [place], [place] [address]  

## 2023-01-19 NOTE — Anesthesia Preprocedure Evaluation (Addendum)
Anesthesia Evaluation  Patient identified by MRN, date of birth, ID band Patient awake    Reviewed: Allergy & Precautions, H&P , NPO status , Patient's Chart, lab work & pertinent test results, reviewed documented beta blocker date and time   Airway Mallampati: II  TM Distance: >3 FB Neck ROM: full    Dental no notable dental hx. (+) Dental Advisory Given, Teeth Intact   Pulmonary neg pulmonary ROS   Pulmonary exam normal breath sounds clear to auscultation       Cardiovascular Exercise Tolerance: Good negative cardio ROS Normal cardiovascular exam Rhythm:regular Rate:Normal     Neuro/Psych negative neurological ROS  negative psych ROS   GI/Hepatic negative GI ROS, Neg liver ROS,,,  Endo/Other  negative endocrine ROS    Renal/GU negative Renal ROS  negative genitourinary   Musculoskeletal   Abdominal   Peds  Hematology negative hematology ROS (+)   Anesthesia Other Findings   Reproductive/Obstetrics negative OB ROS                             Anesthesia Physical Anesthesia Plan  ASA: 1  Anesthesia Plan: MAC   Post-op Pain Management: Minimal or no pain anticipated   Induction:   PONV Risk Score and Plan:   Airway Management Planned: Nasal Cannula and Natural Airway  Additional Equipment: None  Intra-op Plan:   Post-operative Plan:   Informed Consent: I have reviewed the patients History and Physical, chart, labs and discussed the procedure including the risks, benefits and alternatives for the proposed anesthesia with the patient or authorized representative who has indicated his/her understanding and acceptance.     Dental Advisory Given  Plan Discussed with: CRNA  Anesthesia Plan Comments:         Anesthesia Quick Evaluation

## 2023-01-25 ENCOUNTER — Encounter (HOSPITAL_COMMUNITY): Payer: Self-pay | Admitting: Ophthalmology

## 2023-02-12 DIAGNOSIS — E785 Hyperlipidemia, unspecified: Secondary | ICD-10-CM | POA: Diagnosis not present

## 2023-02-12 DIAGNOSIS — Z0000 Encounter for general adult medical examination without abnormal findings: Secondary | ICD-10-CM | POA: Diagnosis not present

## 2023-02-12 DIAGNOSIS — R7309 Other abnormal glucose: Secondary | ICD-10-CM | POA: Diagnosis not present

## 2023-02-19 DIAGNOSIS — Z961 Presence of intraocular lens: Secondary | ICD-10-CM | POA: Diagnosis not present

## 2023-02-19 DIAGNOSIS — Z9842 Cataract extraction status, left eye: Secondary | ICD-10-CM | POA: Diagnosis not present

## 2023-09-13 ENCOUNTER — Encounter (INDEPENDENT_AMBULATORY_CARE_PROVIDER_SITE_OTHER): Payer: Self-pay | Admitting: *Deleted

## 2024-03-03 DIAGNOSIS — Z0000 Encounter for general adult medical examination without abnormal findings: Secondary | ICD-10-CM | POA: Diagnosis not present

## 2024-03-03 DIAGNOSIS — R7303 Prediabetes: Secondary | ICD-10-CM | POA: Diagnosis not present

## 2024-03-03 DIAGNOSIS — Z125 Encounter for screening for malignant neoplasm of prostate: Secondary | ICD-10-CM | POA: Diagnosis not present

## 2024-03-03 DIAGNOSIS — R5383 Other fatigue: Secondary | ICD-10-CM | POA: Diagnosis not present

## 2024-03-03 DIAGNOSIS — E785 Hyperlipidemia, unspecified: Secondary | ICD-10-CM | POA: Diagnosis not present

## 2024-03-04 ENCOUNTER — Encounter (INDEPENDENT_AMBULATORY_CARE_PROVIDER_SITE_OTHER): Payer: Self-pay | Admitting: *Deleted
